# Patient Record
Sex: Female | Born: 1955 | Race: White | Hispanic: No | Marital: Married | Smoking: Never smoker
Health system: Southern US, Community
[De-identification: ages and names within clinical notes are randomized; demographics above are authoritative.]

---

## 2012-10-05 ENCOUNTER — Emergency Department: Payer: Self-pay | Admitting: Emergency Medicine

## 2012-10-05 LAB — URINALYSIS, COMPLETE
Bilirubin,UR: NEGATIVE
Glucose,UR: NEGATIVE mg/dL (ref 0–75)
Ketone: NEGATIVE
Ph: 5 (ref 4.5–8.0)
Protein: 30
RBC,UR: 133 /HPF (ref 0–5)
Squamous Epithelial: 12
WBC UR: 321 /HPF (ref 0–5)

## 2016-10-02 ENCOUNTER — Emergency Department
Admission: EM | Admit: 2016-10-02 | Discharge: 2016-10-02 | Disposition: A | Discharge status: Home / Self Care | Payer: BLUE CROSS/BLUE SHIELD | Attending: Emergency Medicine | Admitting: Emergency Medicine

## 2016-10-02 ENCOUNTER — Encounter: Payer: Self-pay | Admitting: Emergency Medicine

## 2016-10-02 DIAGNOSIS — L03211 Cellulitis of face: Secondary | ICD-10-CM | POA: Diagnosis not present

## 2016-10-02 DIAGNOSIS — R21 Rash and other nonspecific skin eruption: Secondary | ICD-10-CM | POA: Diagnosis present

## 2016-10-02 MED ORDER — CEPHALEXIN 500 MG PO CAPS: 500.0000 mg | ORAL_CAPSULE | Freq: Four times a day (QID) | ORAL | 0 refills | Status: AC

## 2016-10-02 MED ORDER — MUPIROCIN 2 % EX OINT: TOPICAL_OINTMENT | CUTANEOUS | 0 refills | Status: AC

## 2016-10-02 NOTE — ED Provider Notes (Signed)
[facility] Emergency Department Provider Note ____________________________________________   First MD Initiated Contact with Patient 10/02/16 [phone]     (approximate)  I have reviewed the triage vital signs and the nursing notes.   HISTORY  Chief Complaint Wound Infection    HPI Timothy Townsel is a 61 y.o. female With no significant past medical history who presents with rash to her nose and face, gradual onset over the last several days, not associated with any specific precipitating wound that she can recall that she thinks it may have been an insect bite. Patient denies fever or chills, difficulty breathing through the nose, or any eye pain or discharge.   History reviewed. No pertinent past medical history.  There are no active problems to display for this patient.   History reviewed. No pertinent surgical history.  Prior to Admission medications   Medication Sig Start Date End Date Taking? Authorizing Provider  cephALEXin (KEFLEX) 500 MG capsule Take 1 capsule (500 mg total) by mouth 4 (four) times daily. 10/02/16 10/09/16  Dionne Bucy, MD  mupirocin ointment Idelle Jo) 2 % Apply to affected area 3 times daily 10/02/16 10/02/17  Dionne Bucy, MD    Allergies Patient has no known allergies.  History reviewed. No pertinent family history.  Social History Social History  Substance Use Topics  . Smoking status: Never Smoker  . Smokeless tobacco: Never Used  . Alcohol use No    Review of Systems  Constitutional: No fever/chills Eyes: No visual changes. ENT: No nasal congestion.  Cardiovascular: Denies chest pain or palpitations. Respiratory: Denies shortness of breath. Gastrointestinal: No nausea, no vomiting.   Genitourinary: Negative for dysuria.  Musculoskeletal: Negative for back pain. Skin: Positive for rash. Neurological: Negative for headaches, focal weakness or  numbness.   ____________________________________________   PHYSICAL EXAM:  VITAL SIGNS: ED Triage Vitals  Enc Vitals Group     BP 10/02/16 0502 (!) 193/120     Pulse Rate 10/02/16 0502 (!) 122     Resp 10/02/16 0502 (!) 22     Temp 10/02/16 0502 98.2 F (36.8 C)     Temp Source 10/02/16 0502 Oral     SpO2 10/02/16 0502 97 %     Weight 10/02/16 0459 250 lb (113.4 kg)     Height 10/02/16 0459  (1.753 m)     Head Circumference --      Peak Flow --      Pain Score 10/02/16 0459 0     Pain Loc --      Pain Edu? --      Excl. in GC? --     Constitutional: Alert and oriented. Well appearing and in no acute distress. Eyes: Conjunctivae are normal.  Head: Atraumatic.  2cm erythematous rash to nose with scab/crusting, in the midline.  R maxillary area with approx 2x4 cm area of erythema and slight induration.  No mass or fluctuance.  No periorbital swwelling.  Nose: No congestion/rhinnorhea. Mouth/Throat: Mucous membranes are moist.   Neck: Normal range of motion.  Cardiovascular:  Good peripheral circulation. Respiratory: Normal respiratory effort.   Gastrointestinal:  No distention.  Genitourinary: No CVA tenderness. Musculoskeletal: Extremities warm and well perfused.  Neurologic:  Normal speech and language. No gross focal neurologic deficits are appreciated.  Skin:  Skin is warm and dry.  Psychiatric: Mood and affect are normal. Speech and behavior are normal.  ____________________________________________   LABS (all labs ordered are listed, but only abnormal results are displayed)  Labs  Reviewed - No data to display ____________________________________________  EKG   ____________________________________________  RADIOLOGY    ____________________________________________   PROCEDURES  Procedure(s) performed: No    Critical Care performed: No ____________________________________________   INITIAL IMPRESSION / ASSESSMENT AND PLAN / ED  COURSE  Pertinent labs & imaging results that were available during my care of the patient were reviewed by me and considered in my medical decision making (see chart for details).  61 year old female with no significant past medical history presents with rash to nose and right maxillary area. On exam, patient is extremely well-appearing, somewhat tachycardic and hypertensive but other vital signs are normal. Exam of her face is as described.  Presentation consistent with cellulitis, likely from small superficial wound or insect bite. The scabbed area to the nose does not appear to be vesicular and it does cross the midline so I do not suspect shingles. Also possible impetigo/erysipelas but this is less likely based on the appearance. Plan: Will give PO Keflex and topical mupirocin.  The patient is tachycardic and hypertensive she states that she feels extremely anxious in the hospital has white coat hypertension, and denies palpitations, chest pain, headache, or difficulty breathing.  No evidence of end organ dysfunction or indication for workup.   On repeat HR is 116.  Patient would like to go home.  Return precautions given.        ____________________________________________   FINAL CLINICAL IMPRESSION(S) / ED DIAGNOSES  Final diagnoses:  Facial cellulitis      NEW MEDICATIONS STARTED DURING THIS VISIT:  New Prescriptions   CEPHALEXIN (KEFLEX) 500 MG CAPSULE    Take 1 capsule (500 mg total) by mouth 4 (four) times daily.   MUPIROCIN OINTMENT (BACTROBAN) 2 %    Apply to affected area 3 times daily     Note:  This document was prepared using Dragon voice recognition software and may include unintentional dictation errors.    Dionne Bucy, MD 10/02/16 [phone]

## 2016-10-02 NOTE — Discharge Instructions (Signed)
Return to the ER for new or worsening rash, spreading rash, worsening pain, swelling around the eye, fever, or any other symptoms that concern you.  Finish the full course of the oral antibiotic.  You only need to apply the topical cream to any open/scabbed parts of the rash.

## 2016-10-02 NOTE — ED Triage Notes (Signed)
Pt ambulatory to triage in NAD, report swelling/redness noted to nose starting on Wednesday, then black spots appeared and swelling spread to under right eye.  Pt denies any vision changes.  Pt reported there was some tenderness to palpation but no constant pain.  Pt reports itching.

## 2020-12-19 ENCOUNTER — Other Ambulatory Visit: Payer: Self-pay

## 2020-12-19 ENCOUNTER — Emergency Department: Payer: Medicare Other

## 2020-12-19 ENCOUNTER — Emergency Department
Admission: EM | Admit: 2020-12-19 | Discharge: 2020-12-19 | Disposition: A | Discharge status: Home / Self Care | Payer: Medicare Other | Attending: Emergency Medicine | Admitting: Emergency Medicine

## 2020-12-19 DIAGNOSIS — R002 Palpitations: Secondary | ICD-10-CM | POA: Diagnosis not present

## 2020-12-19 DIAGNOSIS — N3000 Acute cystitis without hematuria: Secondary | ICD-10-CM | POA: Diagnosis not present

## 2020-12-19 DIAGNOSIS — R82998 Other abnormal findings in urine: Secondary | ICD-10-CM | POA: Diagnosis present

## 2020-12-19 LAB — CBC
HCT: 45.6 % (ref 36.0–46.0)
Hemoglobin: 15.2 g/dL — ABNORMAL HIGH (ref 12.0–15.0)
MCH: 29.2 pg (ref 26.0–34.0)
MCHC: 33.3 g/dL (ref 30.0–36.0)
MCV: 87.7 fL (ref 80.0–100.0)
Platelets: 261 10*3/uL (ref 150–400)
RBC: 5.2 MIL/uL — ABNORMAL HIGH (ref 3.87–5.11)
RDW: 13.5 % (ref 11.5–15.5)
WBC: 6.5 10*3/uL (ref 4.0–10.5)
nRBC: 0 % (ref 0.0–0.2)

## 2020-12-19 LAB — URINALYSIS, ROUTINE W REFLEX MICROSCOPIC
Bacteria, UA: NONE SEEN
Bilirubin Urine: NEGATIVE
Glucose, UA: NEGATIVE mg/dL
Ketones, ur: NEGATIVE mg/dL
Nitrite: NEGATIVE
Protein, ur: NEGATIVE mg/dL
Specific Gravity, Urine: 1.01 (ref 1.005–1.030)
pH: 5 (ref 5.0–8.0)

## 2020-12-19 LAB — COMPREHENSIVE METABOLIC PANEL
ALT: 537 U/L — ABNORMAL HIGH (ref 0–44)
AST: 398 U/L — ABNORMAL HIGH (ref 15–41)
Albumin: 4.2 g/dL (ref 3.5–5.0)
Alkaline Phosphatase: 120 U/L (ref 38–126)
Anion gap: 7 (ref 5–15)
BUN: 7 mg/dL — ABNORMAL LOW (ref 8–23)
CO2: 22 mmol/L (ref 22–32)
Calcium: 8.9 mg/dL (ref 8.9–10.3)
Chloride: 107 mmol/L (ref 98–111)
Creatinine, Ser: 0.44 mg/dL (ref 0.44–1.00)
GFR, Estimated: >60 mL/min (ref >60)
Glucose, Bld: 111 mg/dL — ABNORMAL HIGH (ref 70–99)
Potassium: 3.8 mmol/L (ref 3.5–5.1)
Sodium: 136 mmol/L (ref 135–145)
Total Bilirubin: 2.9 mg/dL — ABNORMAL HIGH (ref 0.3–1.2)
Total Protein: 7.5 g/dL (ref 6.5–8.1)

## 2020-12-19 LAB — TROPONIN I (HIGH SENSITIVITY): Troponin I (High Sensitivity): 3 ng/L (ref <18)

## 2020-12-19 LAB — LIPASE, BLOOD: Lipase: 35 U/L (ref 11–51)

## 2020-12-19 MED ORDER — CEPHALEXIN 500 MG PO CAPS: 500.0000 mg | ORAL_CAPSULE | Freq: Four times a day (QID) | ORAL | 0 refills | Status: AC

## 2020-12-19 NOTE — ED Triage Notes (Signed)
Pt states that she started having palpitations last pm and then noticed that her urine was an amber color and reports as unusual due to always having clear urine and drinking a lot of water, states that she had some epigastric discomfort and discomfort across her shoulder blades, denies any discomfort now and states that she has a lot of anxiety and doesn't go to the dr, denies any daily medications, reports that her palpitations started prior to her feeling anxious, states that her sister has a hx of a.fib denies low back pain or pain with urination

## 2020-12-19 NOTE — Discharge Instructions (Signed)
Take Keflex antibiotics 4 times daily for the next 5 days to treat a UTI.  Return to the ED with any worsening symptoms.

## 2020-12-19 NOTE — ED Provider Notes (Signed)
[facility] Emergency Department Provider Note ____________________________________________   Event Date/Time   First MD Initiated Contact with Patient 12/19/20 [phone]     (approximate)  I have reviewed the triage vital signs and the nursing notes.  HISTORY  Chief Complaint Palpitations and urine discoloration   HPI Emily Leach is a 65 y.o. femalewho presents to the ED for evaluation of palpitations, tremulousness and dark urine.  Chart review indicates obese patient with history of anxiety.  Takes no medications daily.  Lives at home with her husband.  She presents to the ED for evaluation of acute on chronic anxiety, palpitations and tremulousness.  She presents primarily due to having dark urine last night.  She reports drinking frequent water and only ever having clear-colored urine.  Typically gets up twice per night to void.  She has had a typical frequency without foul-smelling urine or dysuria or hematuria, but reports dark-colored urine last night that "freaked her out."  She reports developing palpitations and a brief episode of epigastric discomfort and across her bilateral shoulders.  Denies falls, trauma or injuries.  Denies substernal chest pain and reports it has been hours since is that she had any epigastric discomfort.  Denies nausea, emesis, diarrhea or continued abdominal pain.  No fevers or recent illnesses.  No shortness of breath, cough.  No past medical history on file.  There are no problems to display for this patient.   No past surgical history on file.  Prior to Admission medications   Medication Sig Start Date End Date Taking? Authorizing Provider  cephALEXin (KEFLEX) 500 MG capsule Take 1 capsule (500 mg total) by mouth 4 (four) times daily for 5 days. 12/19/20 12/24/20 Yes Delton Prairie, MD    Allergies Patient has no known allergies.  No family history on file.  Social History Social History   Tobacco Use   Smoking  status: Never   Smokeless tobacco: Never  Substance Use Topics   Alcohol use: No    Review of Systems  Constitutional: No fever/chills Eyes: No visual changes. ENT: No sore throat. Cardiovascular: Denies chest pain.  Positive for palpitations Respiratory: Denies shortness of breath. Gastrointestinal: Positive for epigastric discomfort, resolved  No nausea, no vomiting.  No diarrhea.  No constipation. Genitourinary: Negative for dysuria. Musculoskeletal: Negative for back pain. Skin: Negative for rash. Neurological: Negative for headaches, focal weakness or numbness.  ____________________________________________   PHYSICAL EXAM:  VITAL SIGNS: Vitals:   12/19/20 0813  BP: (!) 167/86  Pulse: (!) 103  Resp: 18  Temp: 98.5 F (36.9 C)  SpO2: 100%      Constitutional: Alert and oriented.  Well-appearing.  Conversational.  Anxious and obese. Eyes: Conjunctivae are normal. PERRL. EOMI. Head: Atraumatic. Nose: No congestion/rhinnorhea. Mouth/Throat: Mucous membranes are moist.  Oropharynx non-erythematous. Neck: No stridor. No cervical spine tenderness to palpation. Cardiovascular: Tachycardic rate, regular rhythm. Grossly normal heart sounds.  Good peripheral circulation. Respiratory: Normal respiratory effort.  No retractions. Lungs CTAB. Gastrointestinal: Soft , nondistended, nontender to palpation. No CVA tenderness.  Benign throughout with deep palpation, including RUQ and epigastrium. Musculoskeletal: No lower extremity tenderness nor edema.  No joint effusions. No signs of acute trauma. Neurologic:  Normal speech and language. No gross focal neurologic deficits are appreciated. No gait instability noted. Skin:  Skin is warm, dry and intact. No rash noted. Psychiatric: Mood and affect are normal. Speech and behavior are normal.  ____________________________________________   LABS (all labs ordered are listed, but only abnormal results  are displayed)  Labs Reviewed   CBC - Abnormal; Notable for the following components:      Result Value   RBC 5.20 (*)    Hemoglobin 15.2 (*)    All other components within normal limits  URINALYSIS, ROUTINE W REFLEX MICROSCOPIC - Abnormal; Notable for the following components:   Color, Urine AMBER (*)    APPearance HAZY (*)    Hgb urine dipstick SMALL (*)    Leukocytes,Ua LARGE (*)    Non Squamous Epithelial PRESENT (*)    All other components within normal limits  COMPREHENSIVE METABOLIC PANEL - Abnormal; Notable for the following components:   Glucose, Bld 111 (*)    BUN 7 (*)    AST 398 (*)    ALT 537 (*)    Total Bilirubin 2.9 (*)    All other components within normal limits  URINE CULTURE  LIPASE, BLOOD  TROPONIN I (HIGH SENSITIVITY)  TROPONIN I (HIGH SENSITIVITY)   ____________________________________________  12 Lead EKG  Sinus rhythm, rate of 97 bpm.  Leftward axis.  Incomplete right bundle.  No evidence of acute ischemia. ____________________________________________  RADIOLOGY  ED MD interpretation:  CXR reviewed by me without evidence of acute cardiopulmonary pathology.  Official radiology report(s): DG Chest 2 View  Result Date: 12/19/2020 CLINICAL DATA:  Palpitations. EXAM: CHEST - 2 VIEW COMPARISON:  None. FINDINGS: The heart size and mediastinal contours are within normal limits. Mild tortuosity of the thoracic aorta. Both lungs are clear. Slight rightward curvature of the thoracic spine. 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 No acute osseous abnormality. IMPRESSION: No acute cardiopulmonary abnormality. Electronically Signed   By: Ileana Roup M.D.   On: 12/19/2020 08:49    ____________________________________________   PROCEDURES and INTERVENTIONS  Procedure(s) performed (including Critical Care):  Procedures  Medications - No data to display  ____________________________________________   MDM / ED COURSE   Fairly healthy 65 year old female presents  to the ED with dark-colored urine, with evidence of acute cystitis and amenable to outpatient management with initiation of antibiotics.  Noted to be tachycardic in triage, but I suspect is due to more to anxiety and tremulousness of the and systemic illness.  No evidence of sepsis otherwise.  No evidence of pyelonephritis.  Blood work is benign.  UA with infectious features considering her new onset symptoms, and will be sent for culture.  We will start her on a course of Keflex to treat acute cystitis.  She has had palpitations chronically without acute features.  No evidence of ACS.  Clinical Course as of 12/19/20 1032  Fri Dec 19, 2020  1003 Reassessed and discussed urine findings with signs of acute cystitis.  She reports feeling much better now.  No palpitations, no pain.  We discussed benign work-up otherwise, antibiotics at home and return precautions for the ED. [DS]    Clinical Course User Index [DS] Vladimir Crofts, MD    ____________________________________________   FINAL CLINICAL IMPRESSION(S) / ED DIAGNOSES  Final diagnoses:  Palpitations  Acute cystitis without hematuria     ED Discharge Orders          Ordered    cephALEXin (KEFLEX) 500 MG capsule  4 times daily        12/19/20 1003             Meir Elwood   Note:  This document was prepared using Systems analyst and may include unintentional dictation errors.    Vladimir Crofts, MD 12/19/20 [phone]

## 2020-12-20 LAB — URINE CULTURE

## 2022-12-13 IMAGING — CR DG CHEST 2V
2 series · 2 of 2 positions shown · non-contrast
Comparison: None.

CLINICAL DATA: Palpitations.

EXAM:
CHEST - 2 VIEW

[chest pa]
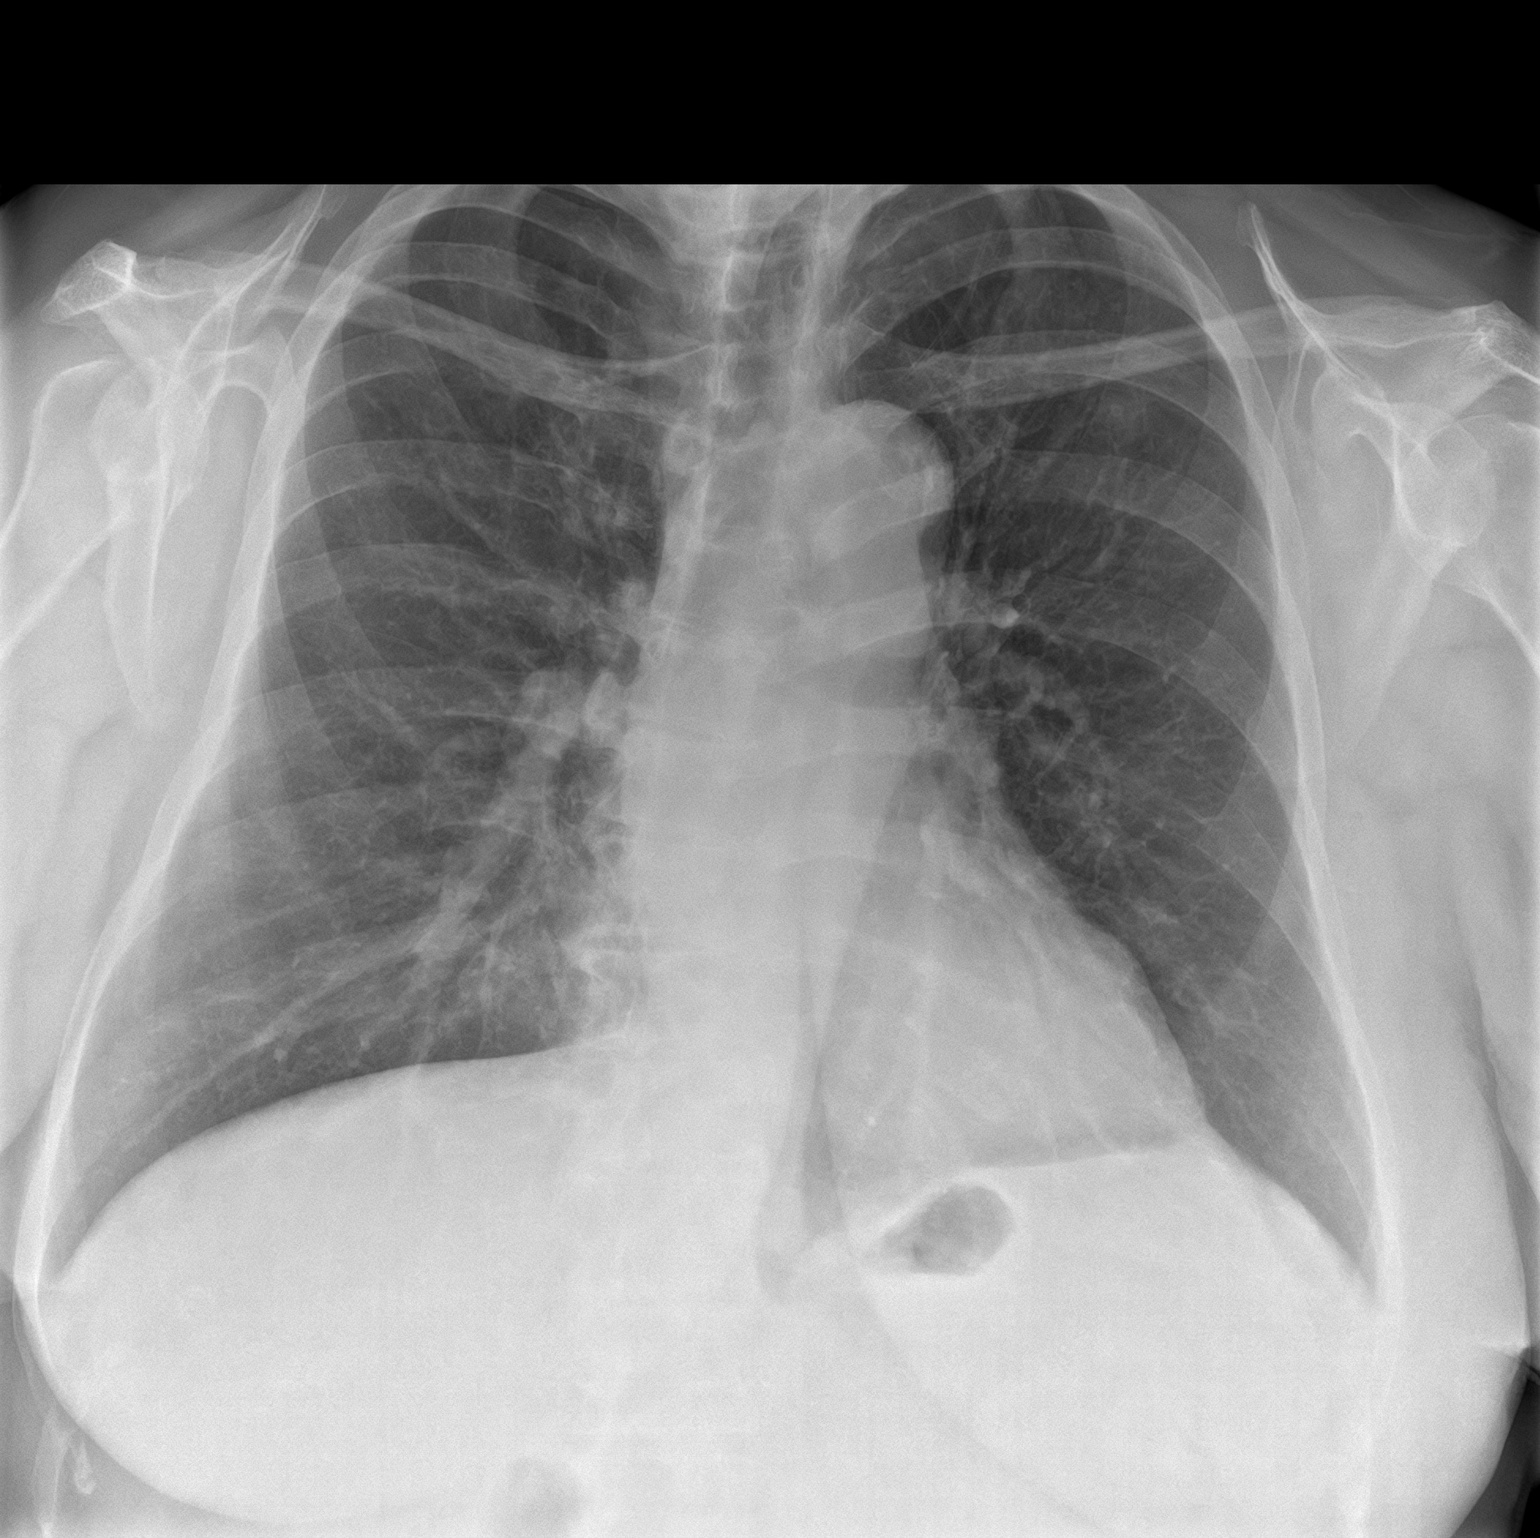

[chest lat]
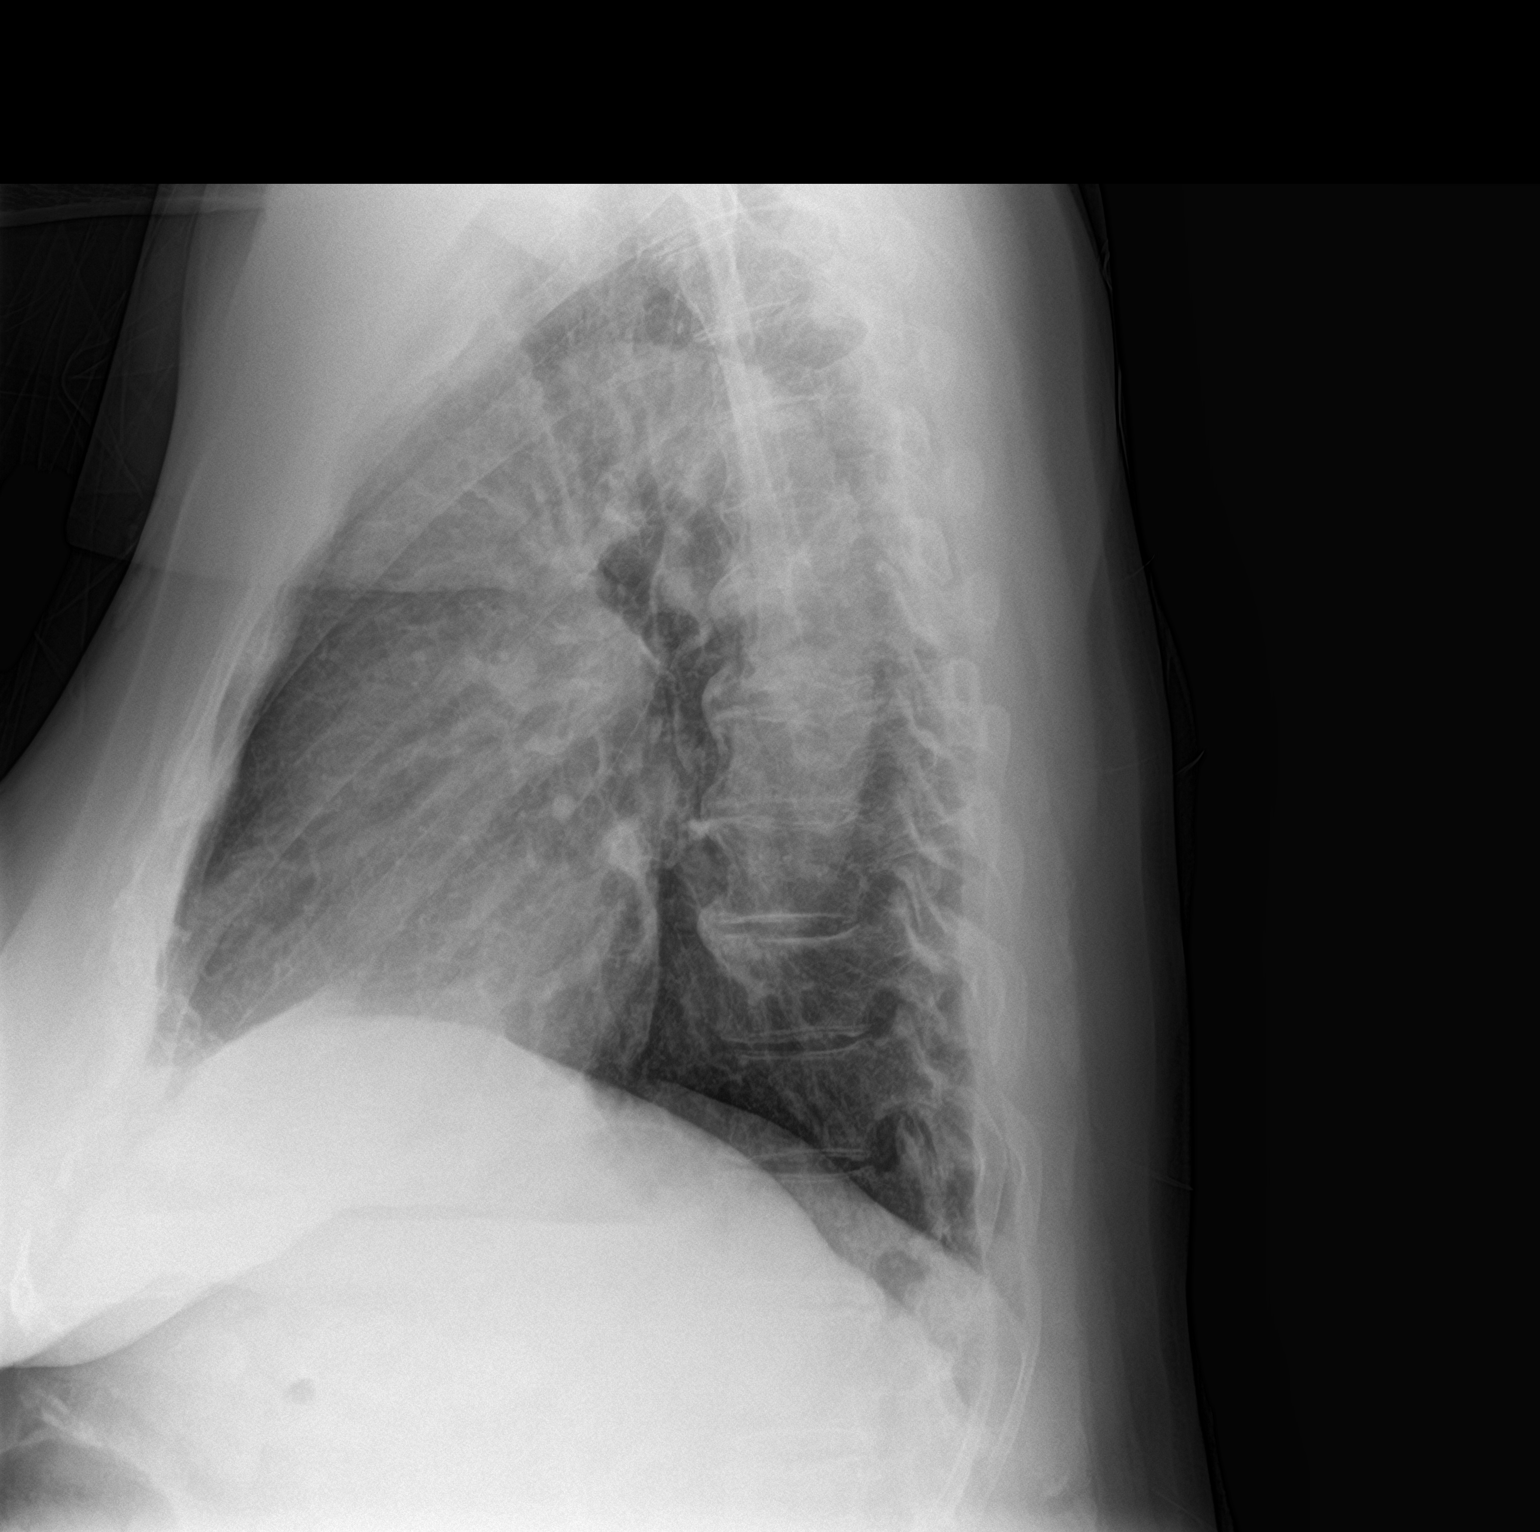

[2 of 2 positions shown; findings below may reference images not displayed]

FINDINGS: The heart size and mediastinal contours are within normal limits.
Mild tortuosity of the thoracic aorta. Both lungs are clear. Slight
rightward curvature of the thoracic spine. Multilevel degenerative
disc disease in the midthoracic spine. No acute osseous abnormality.
IMPRESSION: No acute cardiopulmonary abnormality.
